# Patient Record
Sex: Male | Born: 1980
Health system: Southern US, Community
[De-identification: ages and names within clinical notes are randomized; demographics above are authoritative.]

---

## 2015-10-28 DIAGNOSIS — E291 Testicular hypofunction: Secondary | ICD-10-CM | POA: Diagnosis not present

## 2015-11-18 DIAGNOSIS — E291 Testicular hypofunction: Secondary | ICD-10-CM | POA: Diagnosis not present

## 2015-12-09 DIAGNOSIS — E291 Testicular hypofunction: Secondary | ICD-10-CM | POA: Diagnosis not present

## 2015-12-30 DIAGNOSIS — E291 Testicular hypofunction: Secondary | ICD-10-CM | POA: Diagnosis not present

## 2016-01-19 DIAGNOSIS — E291 Testicular hypofunction: Secondary | ICD-10-CM | POA: Diagnosis not present

## 2016-02-09 DIAGNOSIS — E291 Testicular hypofunction: Secondary | ICD-10-CM | POA: Diagnosis not present

## 2016-03-01 DIAGNOSIS — E291 Testicular hypofunction: Secondary | ICD-10-CM | POA: Diagnosis not present

## 2016-03-19 DIAGNOSIS — E291 Testicular hypofunction: Secondary | ICD-10-CM | POA: Diagnosis not present

## 2016-04-12 DIAGNOSIS — E291 Testicular hypofunction: Secondary | ICD-10-CM | POA: Diagnosis not present

## 2016-04-12 DIAGNOSIS — Z0000 Encounter for general adult medical examination without abnormal findings: Secondary | ICD-10-CM | POA: Diagnosis not present

## 2016-04-12 DIAGNOSIS — J309 Allergic rhinitis, unspecified: Secondary | ICD-10-CM | POA: Diagnosis not present

## 2016-04-19 DIAGNOSIS — E291 Testicular hypofunction: Secondary | ICD-10-CM | POA: Diagnosis not present

## 2016-04-26 DIAGNOSIS — E291 Testicular hypofunction: Secondary | ICD-10-CM | POA: Diagnosis not present

## 2016-05-11 DIAGNOSIS — E291 Testicular hypofunction: Secondary | ICD-10-CM | POA: Diagnosis not present

## 2016-05-18 DIAGNOSIS — E291 Testicular hypofunction: Secondary | ICD-10-CM | POA: Diagnosis not present

## 2016-05-25 DIAGNOSIS — E291 Testicular hypofunction: Secondary | ICD-10-CM | POA: Diagnosis not present

## 2016-06-08 DIAGNOSIS — E291 Testicular hypofunction: Secondary | ICD-10-CM | POA: Diagnosis not present

## 2016-06-22 DIAGNOSIS — E291 Testicular hypofunction: Secondary | ICD-10-CM | POA: Diagnosis not present

## 2016-07-06 DIAGNOSIS — E291 Testicular hypofunction: Secondary | ICD-10-CM | POA: Diagnosis not present

## 2016-07-20 DIAGNOSIS — E291 Testicular hypofunction: Secondary | ICD-10-CM | POA: Diagnosis not present

## 2016-07-28 DIAGNOSIS — R0600 Dyspnea, unspecified: Secondary | ICD-10-CM | POA: Diagnosis not present

## 2016-08-03 DIAGNOSIS — E291 Testicular hypofunction: Secondary | ICD-10-CM | POA: Diagnosis not present

## 2016-08-17 DIAGNOSIS — E291 Testicular hypofunction: Secondary | ICD-10-CM | POA: Diagnosis not present

## 2016-09-01 DIAGNOSIS — E291 Testicular hypofunction: Secondary | ICD-10-CM | POA: Diagnosis not present

## 2016-09-15 DIAGNOSIS — E291 Testicular hypofunction: Secondary | ICD-10-CM | POA: Diagnosis not present

## 2016-09-29 DIAGNOSIS — E291 Testicular hypofunction: Secondary | ICD-10-CM | POA: Diagnosis not present

## 2016-10-13 DIAGNOSIS — E291 Testicular hypofunction: Secondary | ICD-10-CM | POA: Diagnosis not present

## 2016-10-27 DIAGNOSIS — E291 Testicular hypofunction: Secondary | ICD-10-CM | POA: Diagnosis not present

## 2016-11-10 DIAGNOSIS — E291 Testicular hypofunction: Secondary | ICD-10-CM | POA: Diagnosis not present

## 2016-11-24 DIAGNOSIS — E291 Testicular hypofunction: Secondary | ICD-10-CM | POA: Diagnosis not present

## 2016-12-08 DIAGNOSIS — E291 Testicular hypofunction: Secondary | ICD-10-CM | POA: Diagnosis not present

## 2016-12-22 DIAGNOSIS — E291 Testicular hypofunction: Secondary | ICD-10-CM | POA: Diagnosis not present

## 2017-01-05 DIAGNOSIS — E291 Testicular hypofunction: Secondary | ICD-10-CM | POA: Diagnosis not present

## 2017-01-20 DIAGNOSIS — E291 Testicular hypofunction: Secondary | ICD-10-CM | POA: Diagnosis not present

## 2017-02-03 DIAGNOSIS — E291 Testicular hypofunction: Secondary | ICD-10-CM | POA: Diagnosis not present

## 2017-02-17 DIAGNOSIS — E291 Testicular hypofunction: Secondary | ICD-10-CM | POA: Diagnosis not present

## 2017-03-03 DIAGNOSIS — E291 Testicular hypofunction: Secondary | ICD-10-CM | POA: Diagnosis not present

## 2017-03-17 DIAGNOSIS — E291 Testicular hypofunction: Secondary | ICD-10-CM | POA: Diagnosis not present

## 2017-03-31 DIAGNOSIS — E291 Testicular hypofunction: Secondary | ICD-10-CM | POA: Diagnosis not present

## 2017-08-29 DIAGNOSIS — Z0000 Encounter for general adult medical examination without abnormal findings: Secondary | ICD-10-CM | POA: Diagnosis not present

## 2017-09-30 DIAGNOSIS — Z131 Encounter for screening for diabetes mellitus: Secondary | ICD-10-CM | POA: Diagnosis not present

## 2017-09-30 DIAGNOSIS — Z136 Encounter for screening for cardiovascular disorders: Secondary | ICD-10-CM | POA: Diagnosis not present

## 2017-09-30 DIAGNOSIS — Z0000 Encounter for general adult medical examination without abnormal findings: Secondary | ICD-10-CM | POA: Diagnosis not present

## 2017-09-30 DIAGNOSIS — Z125 Encounter for screening for malignant neoplasm of prostate: Secondary | ICD-10-CM | POA: Diagnosis not present

## 2017-09-30 DIAGNOSIS — E291 Testicular hypofunction: Secondary | ICD-10-CM | POA: Diagnosis not present

## 2017-10-24 DIAGNOSIS — R202 Paresthesia of skin: Secondary | ICD-10-CM | POA: Diagnosis not present

## 2017-10-24 DIAGNOSIS — M549 Dorsalgia, unspecified: Secondary | ICD-10-CM | POA: Diagnosis not present

## 2017-11-07 DIAGNOSIS — M5410 Radiculopathy, site unspecified: Secondary | ICD-10-CM | POA: Diagnosis not present

## 2017-11-07 DIAGNOSIS — E538 Deficiency of other specified B group vitamins: Secondary | ICD-10-CM | POA: Diagnosis not present

## 2018-04-13 DIAGNOSIS — E291 Testicular hypofunction: Secondary | ICD-10-CM | POA: Diagnosis not present

## 2019-04-19 DIAGNOSIS — Z131 Encounter for screening for diabetes mellitus: Secondary | ICD-10-CM | POA: Diagnosis not present

## 2019-04-19 DIAGNOSIS — Z0000 Encounter for general adult medical examination without abnormal findings: Secondary | ICD-10-CM | POA: Diagnosis not present

## 2019-04-19 DIAGNOSIS — E291 Testicular hypofunction: Secondary | ICD-10-CM | POA: Diagnosis not present

## 2019-04-19 DIAGNOSIS — E669 Obesity, unspecified: Secondary | ICD-10-CM | POA: Diagnosis not present

## 2019-04-19 DIAGNOSIS — Z13220 Encounter for screening for lipoid disorders: Secondary | ICD-10-CM | POA: Diagnosis not present

## 2019-06-05 DIAGNOSIS — Z20828 Contact with and (suspected) exposure to other viral communicable diseases: Secondary | ICD-10-CM | POA: Diagnosis not present

## 2019-07-30 DIAGNOSIS — E291 Testicular hypofunction: Secondary | ICD-10-CM | POA: Diagnosis not present

## 2019-10-09 ENCOUNTER — Ambulatory Visit
Admission: RE | Admit: 2019-10-09 | Discharge: 2019-10-09 | Disposition: A | Discharge status: Home / Self Care | Payer: BC Managed Care – PPO | Source: Ambulatory Visit | Attending: Family Medicine | Admitting: Family Medicine

## 2019-10-09 ENCOUNTER — Other Ambulatory Visit: Payer: Self-pay | Admitting: Family Medicine

## 2019-10-09 DIAGNOSIS — R0600 Dyspnea, unspecified: Secondary | ICD-10-CM

## 2019-10-09 DIAGNOSIS — U071 COVID-19: Secondary | ICD-10-CM | POA: Diagnosis not present

## 2019-10-09 DIAGNOSIS — Z8619 Personal history of other infectious and parasitic diseases: Secondary | ICD-10-CM | POA: Diagnosis not present

## 2019-10-09 DIAGNOSIS — R0609 Other forms of dyspnea: Secondary | ICD-10-CM | POA: Diagnosis not present

## 2019-10-10 DIAGNOSIS — R0609 Other forms of dyspnea: Secondary | ICD-10-CM | POA: Diagnosis not present

## 2019-10-10 DIAGNOSIS — U071 COVID-19: Secondary | ICD-10-CM | POA: Diagnosis not present

## 2019-12-03 DIAGNOSIS — M7989 Other specified soft tissue disorders: Secondary | ICD-10-CM | POA: Diagnosis not present

## 2019-12-03 DIAGNOSIS — M25572 Pain in left ankle and joints of left foot: Secondary | ICD-10-CM | POA: Diagnosis not present

## 2019-12-03 DIAGNOSIS — S99922A Unspecified injury of left foot, initial encounter: Secondary | ICD-10-CM | POA: Diagnosis not present

## 2019-12-03 DIAGNOSIS — S99912A Unspecified injury of left ankle, initial encounter: Secondary | ICD-10-CM | POA: Diagnosis not present

## 2019-12-03 DIAGNOSIS — M79672 Pain in left foot: Secondary | ICD-10-CM | POA: Diagnosis not present

## 2020-08-15 DIAGNOSIS — Z13220 Encounter for screening for lipoid disorders: Secondary | ICD-10-CM | POA: Diagnosis not present

## 2020-08-15 DIAGNOSIS — E291 Testicular hypofunction: Secondary | ICD-10-CM | POA: Diagnosis not present

## 2020-08-15 DIAGNOSIS — Z131 Encounter for screening for diabetes mellitus: Secondary | ICD-10-CM | POA: Diagnosis not present

## 2020-08-15 DIAGNOSIS — Z0000 Encounter for general adult medical examination without abnormal findings: Secondary | ICD-10-CM | POA: Diagnosis not present

## 2020-08-15 DIAGNOSIS — H6121 Impacted cerumen, right ear: Secondary | ICD-10-CM | POA: Diagnosis not present

## 2020-08-18 DIAGNOSIS — E291 Testicular hypofunction: Secondary | ICD-10-CM | POA: Diagnosis not present

## 2020-08-26 ENCOUNTER — Encounter: Payer: Self-pay | Admitting: Nurse Practitioner

## 2020-09-02 ENCOUNTER — Ambulatory Visit
Admission: RE | Admit: 2020-09-02 | Discharge: 2020-09-02 | Disposition: A | Discharge status: Home / Self Care | Payer: BC Managed Care – PPO | Source: Ambulatory Visit | Attending: Nurse Practitioner | Admitting: Nurse Practitioner

## 2020-09-02 ENCOUNTER — Other Ambulatory Visit: Payer: Self-pay

## 2020-09-02 ENCOUNTER — Ambulatory Visit (INDEPENDENT_AMBULATORY_CARE_PROVIDER_SITE_OTHER): Payer: BC Managed Care – PPO | Admitting: Nurse Practitioner

## 2020-09-02 VITALS — BP 127/85 | HR 81 | Temp 99.0°F | Ht 71.0 in | Wt 227.0 lb

## 2020-09-02 DIAGNOSIS — Z8616 Personal history of COVID-19: Secondary | ICD-10-CM

## 2020-09-02 DIAGNOSIS — R0602 Shortness of breath: Secondary | ICD-10-CM | POA: Diagnosis not present

## 2020-09-02 DIAGNOSIS — R079 Chest pain, unspecified: Secondary | ICD-10-CM | POA: Diagnosis not present

## 2020-09-02 NOTE — Patient Instructions (Signed)
History of Covid 19 Shortness of breath:   Stay well hydrated  Stay active  Deep breathing exercises  May take tylenol or fever or pain    Will order chest x ray:  [facility] [address] [place], [place] [address] [phone] MON - FRI 8:00 AM - 4:00 PM - WALK IN   Follow up:  Follow up in 2 weeks or sooner if needed

## 2020-09-02 NOTE — Progress Notes (Signed)
@Patient  ID: , male    DOB: 12/03/1980, 40 y.o.   MRN: [id number]  Chief Complaint  Patient presents with  . Post COVID     Feeling better over all but still out of breath easily     Referring provider: [id number], MD   40 year old male with no significant health history.  HPI  Patient presents today for post COVID care clinic visit.  Patient states that he tested positive for Covid in November 2020.  Since that time he has been having ongoing shortness of breath with exertion.  Patient did have a chest x-ray in March 2021 which did show pneumonitis which is most likely related to previous Covid.  He has not had any imaging since that time.  He states that recently he has started working out at home and has noticed that he is more short of breath that he used to be before he got COVID when he worked out.  It appears that he has not been working out for the past few months since he was sick with Covid he has had a few setbacks during that time and has just recently gotten back to a good workout routine.  He has started weightlifting and jumping rope.  Patient was walked in office today.  O2 sats did drop to 94% on room air during the walk and heart rate was slightly elevated at 103 bpm.  We discussed that we will start by checking a repeat chest x-ray.  We discussed that patient may need pulmonary and cardiology eval. Denies f/c/s, n/v/d, hemoptysis, PND, chest pain or edema.      No Known Allergies   There is no immunization history on file for this patient.  No past medical history on file.  Tobacco History: Social History   Tobacco Use  Smoking Status Never Smoker  Smokeless Tobacco Never Used   Counseling given: Not Answered   Outpatient Encounter Medications as of 09/02/2020  Medication Sig  . Testosterone (ANDROGEL PUMP) 20.25 MG/ACT (1.62%) GEL 2 pumps alternating with 3 pumps  to skin in the morning to shoulder, upper arms or abdomen   No  facility-administered encounter medications on file as of 09/02/2020.     Review of Systems  Review of Systems  Constitutional: Negative.  Negative for fatigue and fever.  HENT: Negative.   Respiratory: Positive for shortness of breath. Negative for cough.   Cardiovascular: Negative.  Negative for chest pain, palpitations and leg swelling.  Gastrointestinal: Negative.   Allergic/Immunologic: Negative.   Neurological: Negative.   Psychiatric/Behavioral: Negative.        Physical Exam  BP 127/85   Pulse 81   Temp 99 F (37.2 C)   Ht 5\' 11"  (1.803 m)   Wt 227 lb (103 kg)   SpO2 98%   BMI 31.66 kg/m   Wt Readings from Last 5 Encounters:  09/02/20 227 lb (103 kg)     Physical Exam Vitals and nursing note reviewed.  Constitutional:      General: He is not in acute distress.    Appearance: He is well-developed and well-nourished.  Cardiovascular:     Rate and Rhythm: Normal rate and regular rhythm.  Pulmonary:     Effort: Pulmonary effort is normal.     Breath sounds: Normal breath sounds.  Musculoskeletal:     Right lower leg: No edema.     Left lower leg: No edema.  Skin:    General: Skin is warm and dry.  Neurological:     Mental Status: He is alert and oriented to person, place, and time.  Psychiatric:        Mood and Affect: Mood and affect and mood normal.        Behavior: Behavior normal.        Assessment & Plan:   History of COVID-19 Shortness of breath:   Stay well hydrated  Stay active  Deep breathing exercises  May take tylenol or fever or pain    Will order chest x ray:  [facility] [address] [place], [place] [address] [phone] MON - FRI 8:00 AM - 4:00 PM - WALK IN   Follow up:  Follow up in 2 weeks or sooner if needed      Ivonne Andrew, NP 09/02/2020

## 2020-09-02 NOTE — Assessment & Plan Note (Signed)
Shortness of breath:   Stay well hydrated  Stay active  Deep breathing exercises  May take tylenol or fever or pain    Will order chest x ray:  [facility] [address] [place], [place] [address] [phone] MON - FRI 8:00 AM - 4:00 PM - WALK IN   Follow up:  Follow up in 2 weeks or sooner if needed

## 2020-09-08 ENCOUNTER — Other Ambulatory Visit: Payer: Self-pay | Admitting: Nurse Practitioner

## 2020-09-08 DIAGNOSIS — R0602 Shortness of breath: Secondary | ICD-10-CM

## 2020-09-08 DIAGNOSIS — Z8616 Personal history of COVID-19: Secondary | ICD-10-CM

## 2020-09-10 DIAGNOSIS — E291 Testicular hypofunction: Secondary | ICD-10-CM | POA: Diagnosis not present

## 2020-09-15 DIAGNOSIS — M6281 Muscle weakness (generalized): Secondary | ICD-10-CM | POA: Diagnosis not present

## 2020-09-15 DIAGNOSIS — R61 Generalized hyperhidrosis: Secondary | ICD-10-CM | POA: Diagnosis not present

## 2020-09-15 DIAGNOSIS — R6882 Decreased libido: Secondary | ICD-10-CM | POA: Diagnosis not present

## 2020-09-15 DIAGNOSIS — E291 Testicular hypofunction: Secondary | ICD-10-CM | POA: Diagnosis not present

## 2021-02-16 DIAGNOSIS — D751 Secondary polycythemia: Secondary | ICD-10-CM | POA: Diagnosis not present

## 2021-02-16 DIAGNOSIS — E291 Testicular hypofunction: Secondary | ICD-10-CM | POA: Diagnosis not present

## 2021-02-16 DIAGNOSIS — R6882 Decreased libido: Secondary | ICD-10-CM | POA: Diagnosis not present

## 2021-02-16 DIAGNOSIS — Z6830 Body mass index (BMI) 30.0-30.9, adult: Secondary | ICD-10-CM | POA: Diagnosis not present

## 2021-02-26 DIAGNOSIS — E291 Testicular hypofunction: Secondary | ICD-10-CM | POA: Diagnosis not present

## 2021-05-25 DIAGNOSIS — E291 Testicular hypofunction: Secondary | ICD-10-CM | POA: Diagnosis not present

## 2021-05-25 DIAGNOSIS — Z79890 Hormone replacement therapy (postmenopausal): Secondary | ICD-10-CM | POA: Diagnosis not present

## 2021-05-27 DIAGNOSIS — D751 Secondary polycythemia: Secondary | ICD-10-CM | POA: Diagnosis not present

## 2021-05-27 DIAGNOSIS — R61 Generalized hyperhidrosis: Secondary | ICD-10-CM | POA: Diagnosis not present

## 2021-05-27 DIAGNOSIS — E291 Testicular hypofunction: Secondary | ICD-10-CM | POA: Diagnosis not present

## 2021-05-27 DIAGNOSIS — Z6831 Body mass index (BMI) 31.0-31.9, adult: Secondary | ICD-10-CM | POA: Diagnosis not present

## 2021-08-25 DIAGNOSIS — E291 Testicular hypofunction: Secondary | ICD-10-CM | POA: Diagnosis not present

## 2021-08-25 DIAGNOSIS — Z0000 Encounter for general adult medical examination without abnormal findings: Secondary | ICD-10-CM | POA: Diagnosis not present

## 2021-08-25 DIAGNOSIS — R310 Gross hematuria: Secondary | ICD-10-CM | POA: Diagnosis not present

## 2021-08-25 DIAGNOSIS — E669 Obesity, unspecified: Secondary | ICD-10-CM | POA: Diagnosis not present

## 2021-08-25 DIAGNOSIS — E7800 Pure hypercholesterolemia, unspecified: Secondary | ICD-10-CM | POA: Diagnosis not present

## 2021-08-25 DIAGNOSIS — D751 Secondary polycythemia: Secondary | ICD-10-CM | POA: Diagnosis not present

## 2021-08-31 DIAGNOSIS — Z79890 Hormone replacement therapy (postmenopausal): Secondary | ICD-10-CM | POA: Diagnosis not present

## 2021-08-31 DIAGNOSIS — E291 Testicular hypofunction: Secondary | ICD-10-CM | POA: Diagnosis not present

## 2021-09-02 DIAGNOSIS — E291 Testicular hypofunction: Secondary | ICD-10-CM | POA: Diagnosis not present

## 2021-09-02 DIAGNOSIS — Z6831 Body mass index (BMI) 31.0-31.9, adult: Secondary | ICD-10-CM | POA: Diagnosis not present

## 2021-09-02 DIAGNOSIS — D751 Secondary polycythemia: Secondary | ICD-10-CM | POA: Diagnosis not present

## 2021-09-07 DIAGNOSIS — F5104 Psychophysiologic insomnia: Secondary | ICD-10-CM | POA: Diagnosis not present

## 2021-09-07 DIAGNOSIS — G4733 Obstructive sleep apnea (adult) (pediatric): Secondary | ICD-10-CM | POA: Diagnosis not present

## 2021-09-24 DIAGNOSIS — G4733 Obstructive sleep apnea (adult) (pediatric): Secondary | ICD-10-CM | POA: Diagnosis not present

## 2021-10-25 DIAGNOSIS — G4733 Obstructive sleep apnea (adult) (pediatric): Secondary | ICD-10-CM | POA: Diagnosis not present

## 2021-11-24 DIAGNOSIS — G4733 Obstructive sleep apnea (adult) (pediatric): Secondary | ICD-10-CM | POA: Diagnosis not present

## 2021-11-30 DIAGNOSIS — F5112 Insufficient sleep syndrome: Secondary | ICD-10-CM | POA: Diagnosis not present

## 2021-11-30 DIAGNOSIS — G4733 Obstructive sleep apnea (adult) (pediatric): Secondary | ICD-10-CM | POA: Diagnosis not present

## 2021-12-23 DIAGNOSIS — E291 Testicular hypofunction: Secondary | ICD-10-CM | POA: Diagnosis not present

## 2021-12-23 DIAGNOSIS — Z79890 Hormone replacement therapy (postmenopausal): Secondary | ICD-10-CM | POA: Diagnosis not present

## 2021-12-25 DIAGNOSIS — G4733 Obstructive sleep apnea (adult) (pediatric): Secondary | ICD-10-CM | POA: Diagnosis not present

## 2022-01-05 DIAGNOSIS — R6882 Decreased libido: Secondary | ICD-10-CM | POA: Diagnosis not present

## 2022-01-05 DIAGNOSIS — R61 Generalized hyperhidrosis: Secondary | ICD-10-CM | POA: Diagnosis not present

## 2022-01-05 DIAGNOSIS — Z6830 Body mass index (BMI) 30.0-30.9, adult: Secondary | ICD-10-CM | POA: Diagnosis not present

## 2022-01-05 DIAGNOSIS — E291 Testicular hypofunction: Secondary | ICD-10-CM | POA: Diagnosis not present

## 2022-03-23 DIAGNOSIS — E291 Testicular hypofunction: Secondary | ICD-10-CM | POA: Diagnosis not present

## 2022-03-23 DIAGNOSIS — G4733 Obstructive sleep apnea (adult) (pediatric): Secondary | ICD-10-CM | POA: Diagnosis not present

## 2022-03-23 DIAGNOSIS — E7800 Pure hypercholesterolemia, unspecified: Secondary | ICD-10-CM | POA: Diagnosis not present

## 2022-03-23 DIAGNOSIS — D751 Secondary polycythemia: Secondary | ICD-10-CM | POA: Diagnosis not present

## 2022-04-06 DIAGNOSIS — Z79890 Hormone replacement therapy (postmenopausal): Secondary | ICD-10-CM | POA: Diagnosis not present

## 2022-04-06 DIAGNOSIS — Z1329 Encounter for screening for other suspected endocrine disorder: Secondary | ICD-10-CM | POA: Diagnosis not present

## 2022-04-06 DIAGNOSIS — E291 Testicular hypofunction: Secondary | ICD-10-CM | POA: Diagnosis not present

## 2022-04-08 DIAGNOSIS — E291 Testicular hypofunction: Secondary | ICD-10-CM | POA: Diagnosis not present

## 2022-04-08 DIAGNOSIS — D751 Secondary polycythemia: Secondary | ICD-10-CM | POA: Diagnosis not present

## 2022-04-08 DIAGNOSIS — Z6830 Body mass index (BMI) 30.0-30.9, adult: Secondary | ICD-10-CM | POA: Diagnosis not present

## 2022-04-23 DIAGNOSIS — G4733 Obstructive sleep apnea (adult) (pediatric): Secondary | ICD-10-CM | POA: Diagnosis not present

## 2022-05-28 IMAGING — CR DG CHEST 2V
2 series · 2 of 2 positions shown · non-contrast
Comparison: 10/09/2019

CLINICAL DATA: Shortness of breath, midline chest pain

EXAM:
CHEST - 2 VIEW

[w chest pa]
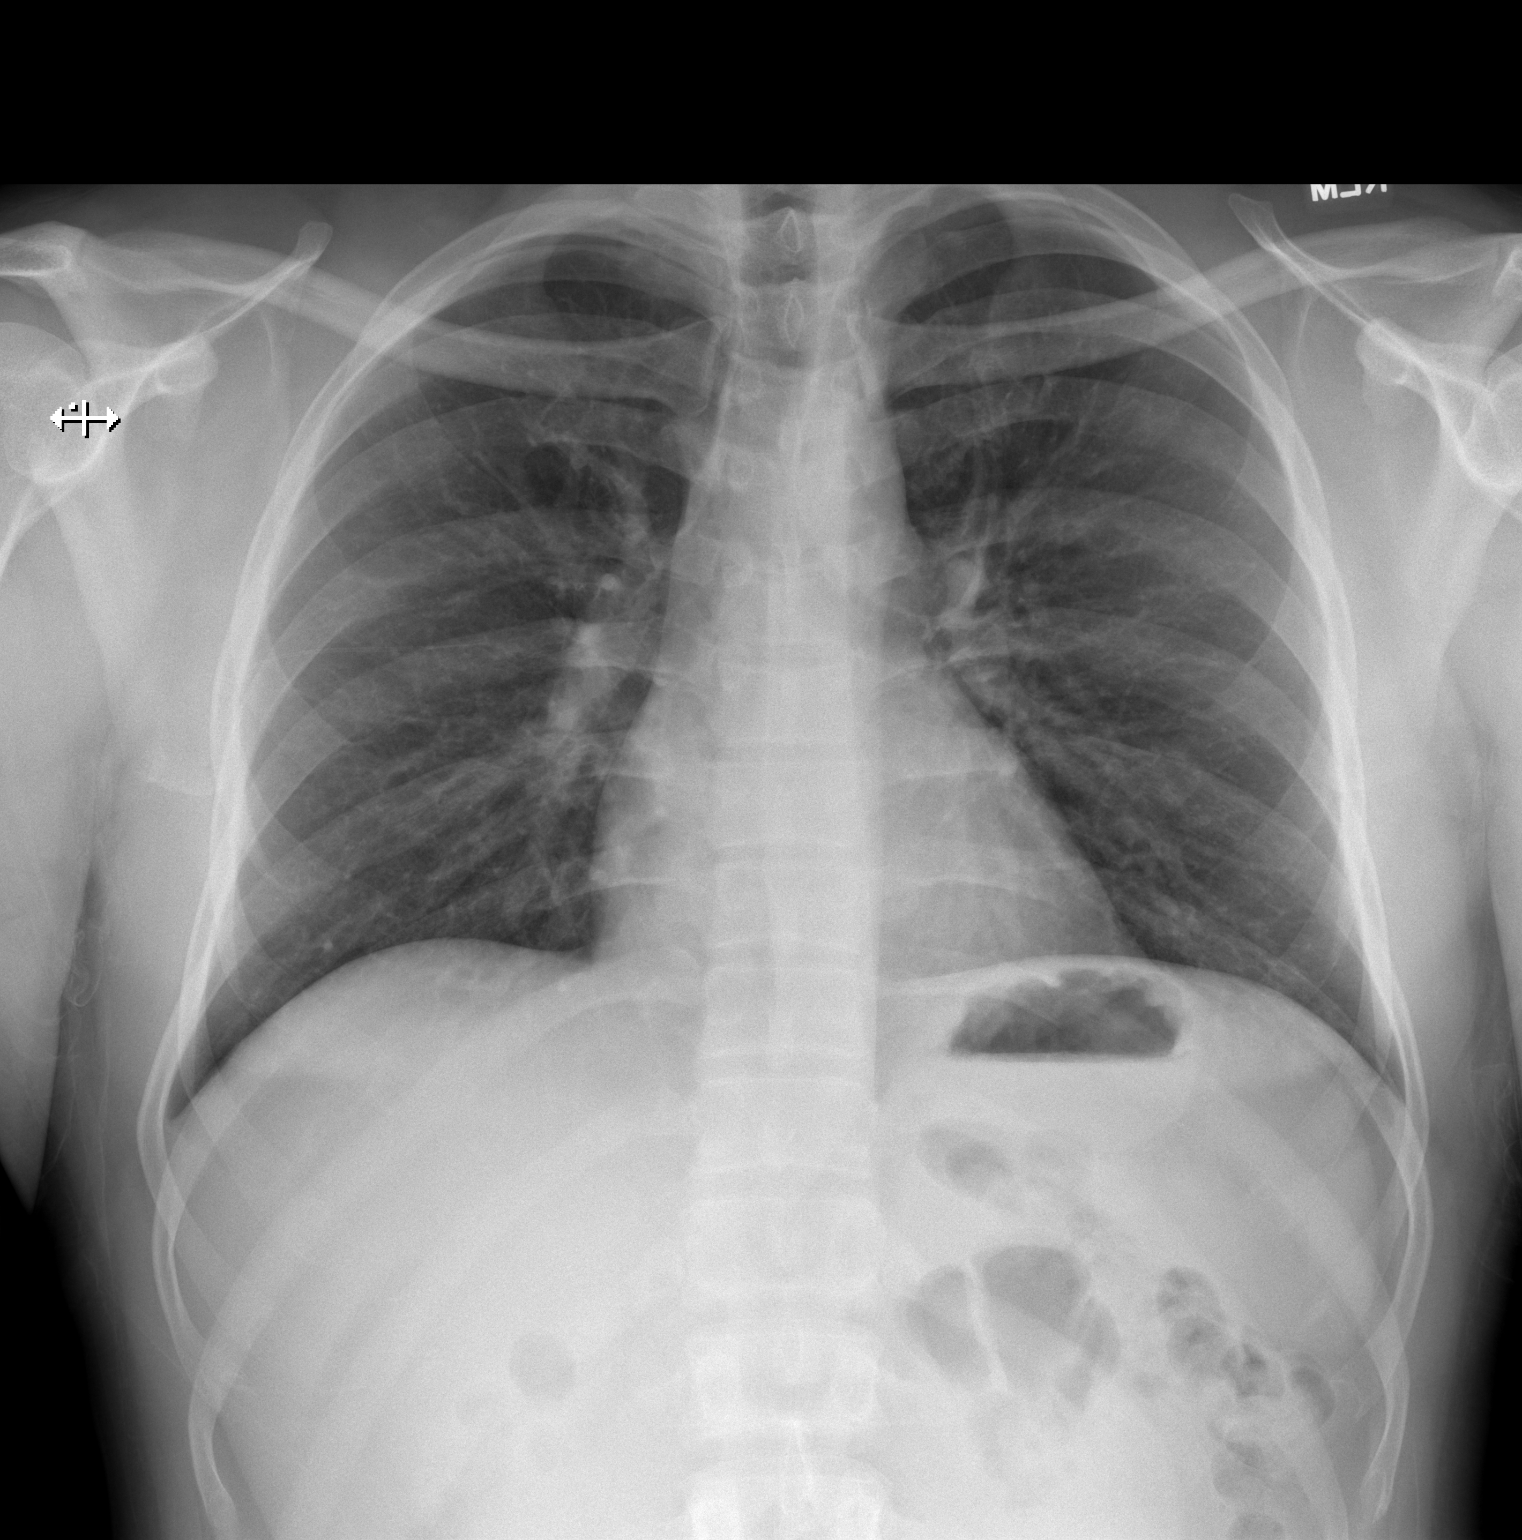

[w chest lat]
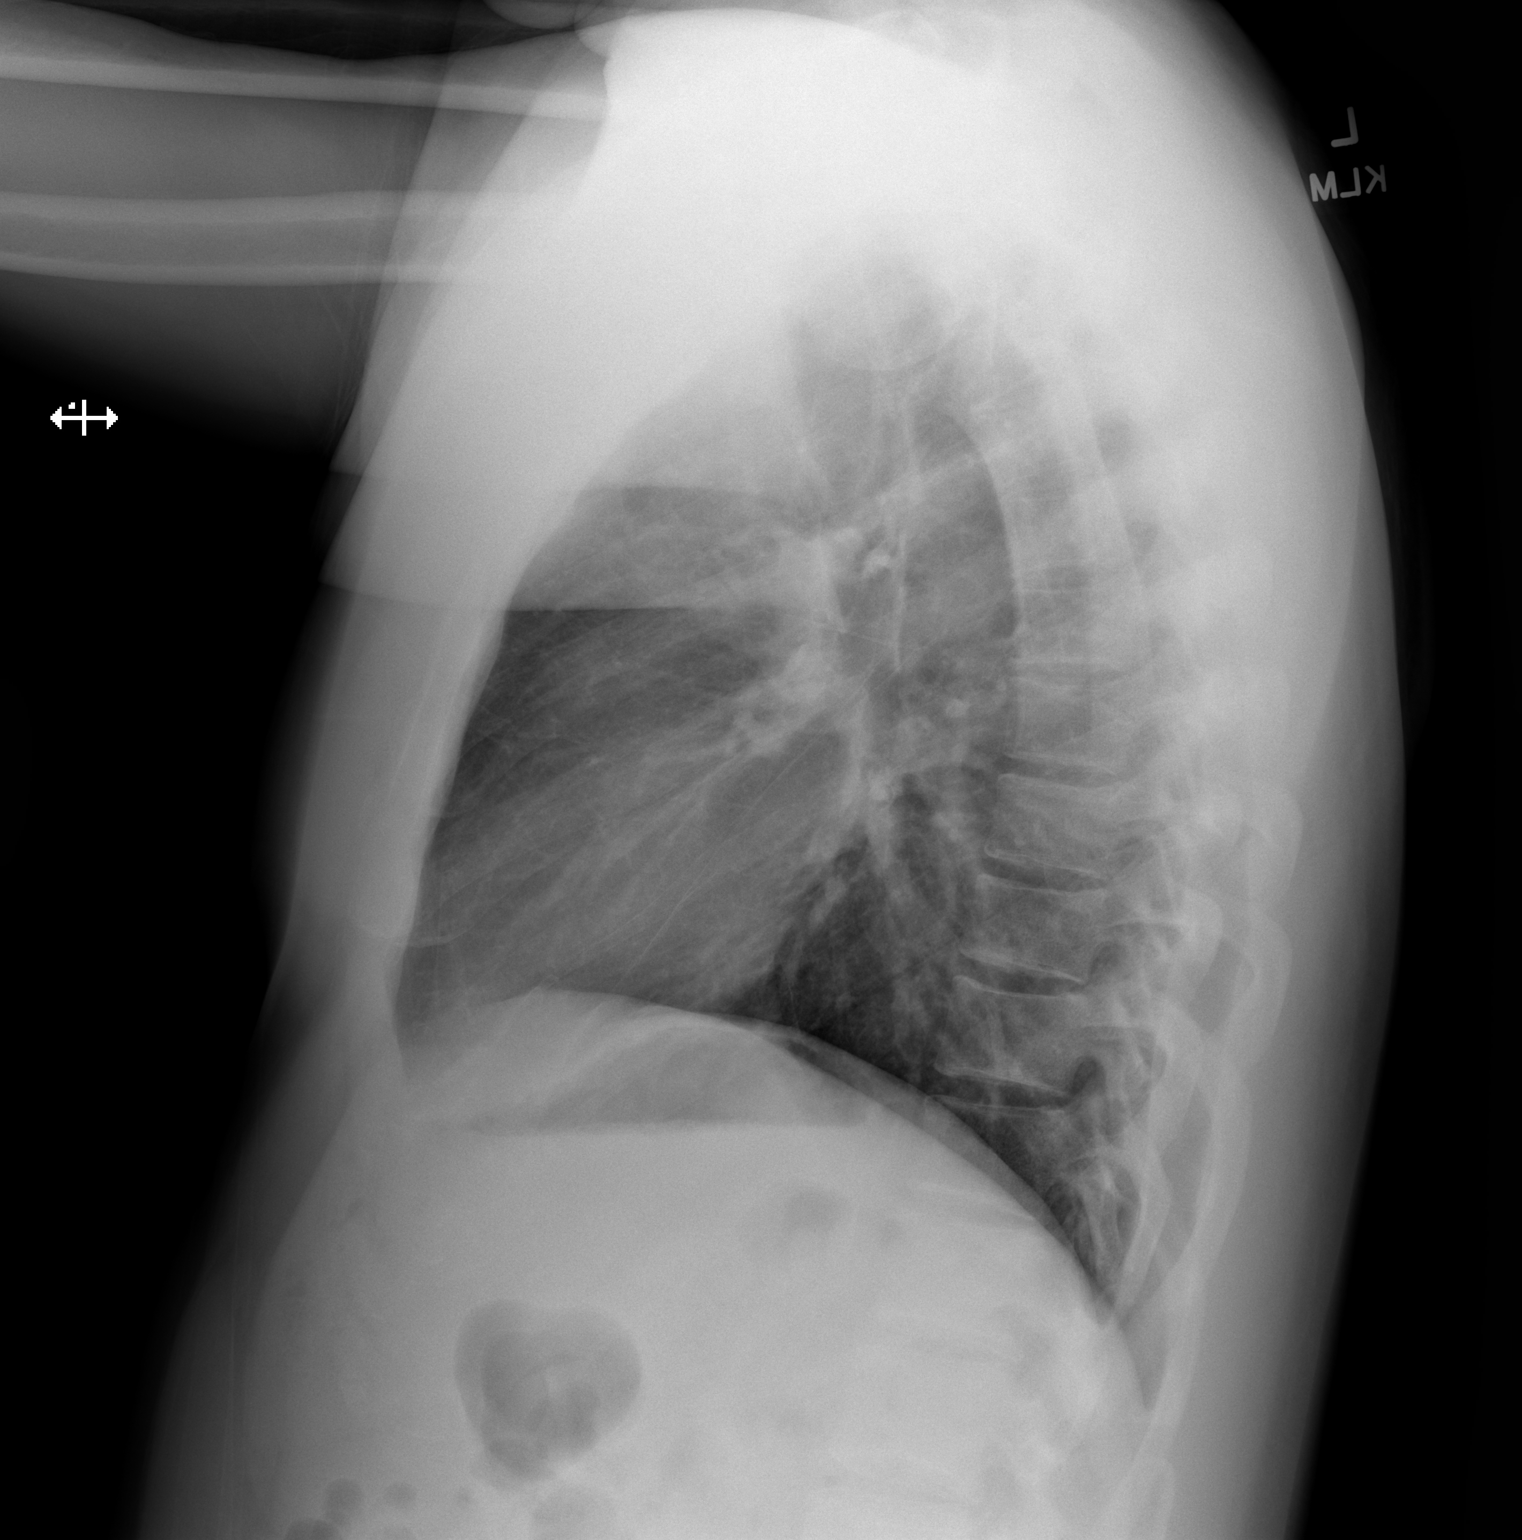

[2 of 2 positions shown; findings below may reference images not displayed]

FINDINGS: The heart size and mediastinal contours are within normal limits.
Both lungs are clear. The visualized skeletal structures are
unremarkable.
IMPRESSION: No active cardiopulmonary disease.

## 2022-07-16 DIAGNOSIS — E291 Testicular hypofunction: Secondary | ICD-10-CM | POA: Diagnosis not present

## 2022-07-16 DIAGNOSIS — Z79890 Hormone replacement therapy (postmenopausal): Secondary | ICD-10-CM | POA: Diagnosis not present

## 2022-07-21 DIAGNOSIS — R6882 Decreased libido: Secondary | ICD-10-CM | POA: Diagnosis not present

## 2022-07-21 DIAGNOSIS — E291 Testicular hypofunction: Secondary | ICD-10-CM | POA: Diagnosis not present

## 2022-07-21 DIAGNOSIS — D751 Secondary polycythemia: Secondary | ICD-10-CM | POA: Diagnosis not present

## 2022-07-21 DIAGNOSIS — Z79890 Hormone replacement therapy (postmenopausal): Secondary | ICD-10-CM | POA: Diagnosis not present

## 2022-08-04 DIAGNOSIS — G4733 Obstructive sleep apnea (adult) (pediatric): Secondary | ICD-10-CM | POA: Diagnosis not present

## 2022-09-24 DIAGNOSIS — E7800 Pure hypercholesterolemia, unspecified: Secondary | ICD-10-CM | POA: Diagnosis not present

## 2022-09-24 DIAGNOSIS — G4733 Obstructive sleep apnea (adult) (pediatric): Secondary | ICD-10-CM | POA: Diagnosis not present

## 2022-09-24 DIAGNOSIS — E669 Obesity, unspecified: Secondary | ICD-10-CM | POA: Diagnosis not present

## 2022-09-24 DIAGNOSIS — E291 Testicular hypofunction: Secondary | ICD-10-CM | POA: Diagnosis not present

## 2022-09-24 DIAGNOSIS — D751 Secondary polycythemia: Secondary | ICD-10-CM | POA: Diagnosis not present

## 2022-09-24 DIAGNOSIS — Z0000 Encounter for general adult medical examination without abnormal findings: Secondary | ICD-10-CM | POA: Diagnosis not present

## 2022-09-24 DIAGNOSIS — Z23 Encounter for immunization: Secondary | ICD-10-CM | POA: Diagnosis not present

## 2022-10-25 DIAGNOSIS — E291 Testicular hypofunction: Secondary | ICD-10-CM | POA: Diagnosis not present

## 2022-10-25 DIAGNOSIS — Z79890 Hormone replacement therapy (postmenopausal): Secondary | ICD-10-CM | POA: Diagnosis not present

## 2022-10-27 DIAGNOSIS — D751 Secondary polycythemia: Secondary | ICD-10-CM | POA: Diagnosis not present

## 2022-10-27 DIAGNOSIS — E291 Testicular hypofunction: Secondary | ICD-10-CM | POA: Diagnosis not present

## 2022-10-27 DIAGNOSIS — R6882 Decreased libido: Secondary | ICD-10-CM | POA: Diagnosis not present

## 2022-10-27 DIAGNOSIS — Z6832 Body mass index (BMI) 32.0-32.9, adult: Secondary | ICD-10-CM | POA: Diagnosis not present

## 2022-11-16 DIAGNOSIS — G4733 Obstructive sleep apnea (adult) (pediatric): Secondary | ICD-10-CM | POA: Diagnosis not present

## 2022-11-23 DIAGNOSIS — H40053 Ocular hypertension, bilateral: Secondary | ICD-10-CM | POA: Diagnosis not present

## 2022-12-21 DIAGNOSIS — H40053 Ocular hypertension, bilateral: Secondary | ICD-10-CM | POA: Diagnosis not present

## 2023-01-05 DIAGNOSIS — R61 Generalized hyperhidrosis: Secondary | ICD-10-CM | POA: Diagnosis not present

## 2023-01-05 DIAGNOSIS — R0602 Shortness of breath: Secondary | ICD-10-CM | POA: Diagnosis not present

## 2023-01-05 DIAGNOSIS — R002 Palpitations: Secondary | ICD-10-CM | POA: Diagnosis not present

## 2023-01-25 DIAGNOSIS — E291 Testicular hypofunction: Secondary | ICD-10-CM | POA: Diagnosis not present

## 2023-01-25 DIAGNOSIS — Z79890 Hormone replacement therapy (postmenopausal): Secondary | ICD-10-CM | POA: Diagnosis not present

## 2023-01-26 DIAGNOSIS — R002 Palpitations: Secondary | ICD-10-CM | POA: Diagnosis not present
